# Patient Record
Sex: Male | Born: 2005 | Race: White | Hispanic: Yes | Marital: Single | Smoking: Never smoker
Health system: Southern US, Community
[De-identification: ages and names within clinical notes are randomized; demographics above are authoritative.]

## PROBLEM LIST (undated history)

## (undated) ENCOUNTER — Emergency Department (HOSPITAL_COMMUNITY): Disposition: A | Discharge status: Home / Self Care | Payer: Self-pay

## (undated) DIAGNOSIS — A630 Anogenital (venereal) warts: Secondary | ICD-10-CM

## (undated) DIAGNOSIS — J45909 Unspecified asthma, uncomplicated: Secondary | ICD-10-CM

## (undated) HISTORY — PX: NO PAST SURGERIES: SHX2092

---
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
## 2023-07-05 ENCOUNTER — Ambulatory Visit: Payer: Self-pay | Admitting: General Surgery

## 2023-07-05 NOTE — H&P (Signed)
PROVIDER:  Elenora Gamma, MD  MRN: [id number] DOB: [birth year] DATE OF ENCOUNTER: 07/05/2023 Interval History:  Patient underwent laser ablation of anal condyloma on October 14, 2022.  Patient mostly had internal lesions around the dentate line.  4 small external lesions were also ablated.  He states that he did relatively well after surgery. We then did a course of imiquimod cream.  On repeat examination, he had a small recurrence noted at anterior midline.  This was treated with silver nitrate here in the office.   He is here today for his 10-month follow-up evaluation.  Past Medical History:  Diagnosis Date   Anal condyloma    Asthma    Past Surgical History:  Procedure Laterality Date   LASER ABLATION CONDOLAMATA N/A 10/14/2022   Procedure: LASER ABLATION ANAL CONDOLAMATA;  Surgeon: Romie Levee, MD;  Location: [facility];  Service: General;  Laterality: N/A;   NO PAST SURGERIES     No family history on file. Social History   Socioeconomic History   Marital status: Single    Spouse name: Not on file   Number of children: Not on file   Years of education: Not on file   Highest education level: Not on file  Occupational History   Not on file  Tobacco Use   Smoking status: Never   Smokeless tobacco: Never  Vaping Use   Vaping status: Never Used  Substance and Sexual Activity   Alcohol use: Never   Drug use: Never   Sexual activity: Not on file  Other Topics Concern   Not on file  Social History Narrative   Not on file   Social Determinants of Health   Financial Resource Strain: Not on file  Food Insecurity: Not on file  Transportation Needs: Not on file  Physical Activity: Not on file  Stress: Not on file  Social Connections: Not on file  Intimate Partner Violence: Not on file    Current Outpatient Medications:    albuterol (VENTOLIN HFA) 108 (90 Base) MCG/ACT inhaler, Inhale 1-2 puffs into the lungs every 6 (six) hours as needed for  wheezing or shortness of breath., Disp: , Rfl:    loratadine (CLARITIN REDITABS) 10 MG dissolvable tablet, Take 10 mg by mouth as needed for allergies., Disp: , Rfl:    oxyCODONE (OXY IR/ROXICODONE) 5 MG immediate release tablet, Take 1 tablet (5 mg total) by mouth every 6 (six) hours as needed for severe pain., Disp: 20 tablet, Rfl: 0 Allergies  Allergen Reactions   Asa [Aspirin] Rash   Egg-Derived Products Rash    Per pt cooked ok but raw eggs rash   Ibuprofen Rash   Review of Systems - Negative except as stated above    Physical Examination:  Gen: NAD CV: RRR Lungs: CTA Abd: soft Rectal: normal external exam, mass noted at ant midline    Procedure: Anoscopy Surgeon: Maisie Fus After the risks and benefits were explained, written consent was obtained for above procedure.  A medical assistant chaperone was present thoroughout the entire procedure.  Anesthesia: none Diagnosis: h/o anal condyloma Findings: lesion noted at anterior midline at the dentate line.  This does not appear to be resolving   Assessment and Plan:  Nathaniel Vaughan is a 17 y.o. male who underwent laser ablation of mostly internal anal condyloma in Jan 24.  We then did a course of imiquimod cream.  On repeat examination he had a small recurrence noted at anterior midline.  This was treated with  silver nitrate here in the office.  On exam today, he has recurrence of the lesion at anterior midline.  I have recommended repeat laser ablation in the operating room.  Patient is agreeable to this procedure.  Diagnoses and all orders for this visit:  Anal condyloma    The plan was discussed in detail with the patient today, who expressed understanding.  The patient has my contact information, and understands to call me with any additional questions or concerns in the interval.  I would be happy to see the patient back sooner if the need arises.   [name], MD Colon and Rectal Surgery [facility]  Surgery

## 2023-08-29 ENCOUNTER — Encounter (HOSPITAL_BASED_OUTPATIENT_CLINIC_OR_DEPARTMENT_OTHER): Payer: Self-pay | Admitting: General Surgery

## 2023-08-30 ENCOUNTER — Encounter (HOSPITAL_BASED_OUTPATIENT_CLINIC_OR_DEPARTMENT_OTHER): Payer: Self-pay | Admitting: General Surgery

## 2023-08-30 ENCOUNTER — Other Ambulatory Visit: Payer: Self-pay

## 2023-08-30 NOTE — Progress Notes (Signed)
Spoke w/ via phone for pre-op interview---[name] needs dos---- none        Lab results------none COVID test -----patient states asymptomatic no test needed Arrive at -------0915 on Thursday, 09/01/2023 NPO after MN NO Solid Food.  Clear liquids from MN until---0815 Med rec completed Medications to take morning of surgery -----Descovy, Claritin prn, Albuterol inhaler prn Diabetic medication -----n/a Patient instructed no nail polish to be worn day of surgery Patient instructed to bring photo id and insurance card day of surgery Patient aware to have Driver (ride ) / caregiver    for 24 hours after surgery - father, Arnette Norris (Father speaks Spanish only.) Patient Special Instructions -----Bring inhaler on day of surgery.  Pre-Op special Instructions -----none Patient verbalized understanding of instructions that were given at this phone interview. Patient denies chest pain, sob, fever, cough at the interview.

## 2023-09-01 ENCOUNTER — Ambulatory Visit (HOSPITAL_BASED_OUTPATIENT_CLINIC_OR_DEPARTMENT_OTHER): Payer: Self-pay | Admitting: Certified Registered Nurse Anesthetist

## 2023-09-01 ENCOUNTER — Ambulatory Visit (HOSPITAL_BASED_OUTPATIENT_CLINIC_OR_DEPARTMENT_OTHER)
Admission: RE | Admit: 2023-09-01 | Discharge: 2023-09-01 | Disposition: A | Discharge status: Home / Self Care | Payer: Medicaid Other | Attending: General Surgery | Admitting: General Surgery

## 2023-09-01 ENCOUNTER — Encounter (HOSPITAL_BASED_OUTPATIENT_CLINIC_OR_DEPARTMENT_OTHER): Payer: Self-pay | Admitting: General Surgery

## 2023-09-01 ENCOUNTER — Other Ambulatory Visit: Payer: Self-pay

## 2023-09-01 ENCOUNTER — Encounter (HOSPITAL_BASED_OUTPATIENT_CLINIC_OR_DEPARTMENT_OTHER)
Admission: RE | Disposition: A | Discharge status: Home / Self Care | Payer: Self-pay | Source: Home / Self Care | Attending: General Surgery

## 2023-09-01 DIAGNOSIS — A5131 Condyloma latum: Secondary | ICD-10-CM

## 2023-09-01 DIAGNOSIS — A630 Anogenital (venereal) warts: Secondary | ICD-10-CM | POA: Insufficient documentation

## 2023-09-01 DIAGNOSIS — J45909 Unspecified asthma, uncomplicated: Secondary | ICD-10-CM | POA: Insufficient documentation

## 2023-09-01 DIAGNOSIS — Z01818 Encounter for other preprocedural examination: Secondary | ICD-10-CM

## 2023-09-01 HISTORY — PX: LASER ABLATION CONDOLAMATA: SHX5941

## 2023-09-01 SURGERY — ABLATION, CONDYLOMA, USING LASER
Anesthesia: Monitor Anesthesia Care | Site: Rectum

## 2023-09-01 MED ORDER — ONDANSETRON HCL 4 MG/2ML IJ SOLN
INTRAMUSCULAR | Status: DC | PRN
  Administered 2023-09-01: 4 mg via INTRAVENOUS

## 2023-09-01 MED ORDER — GABAPENTIN 300 MG PO CAPS
ORAL_CAPSULE | ORAL | Status: AC
  Filled 2023-09-01: qty 1

## 2023-09-01 MED ORDER — MIDAZOLAM HCL 2 MG/2ML IJ SOLN
INTRAMUSCULAR | Status: DC | PRN
  Administered 2023-09-01: 2 mg via INTRAVENOUS

## 2023-09-01 MED ORDER — SODIUM CHLORIDE 0.9% FLUSH: 3.0000 mL | Freq: Two times a day (BID) | INTRAVENOUS | Status: DC

## 2023-09-01 MED ORDER — BUPIVACAINE LIPOSOME 1.3 % IJ SUSP: 20.0000 mL | Freq: Once | INTRAMUSCULAR | Status: DC

## 2023-09-01 MED ORDER — MIDAZOLAM HCL 2 MG/2ML IJ SOLN
INTRAMUSCULAR | Status: AC
  Filled 2023-09-01: qty 2

## 2023-09-01 MED ORDER — GABAPENTIN 300 MG PO CAPS
300.0000 mg | ORAL_CAPSULE | ORAL | Status: AC
  Administered 2023-09-01: 300 mg via ORAL

## 2023-09-01 MED ORDER — ACETAMINOPHEN 500 MG PO TABS
1000.0000 mg | ORAL_TABLET | ORAL | Status: AC
  Administered 2023-09-01: 1000 mg via ORAL

## 2023-09-01 MED ORDER — PROPOFOL 500 MG/50ML IV EMUL
INTRAVENOUS | Status: DC | PRN
  Administered 2023-09-01: 200 ug/kg/min via INTRAVENOUS

## 2023-09-01 MED ORDER — ACETIC ACID 5 % SOLN
Status: DC | PRN
  Administered 2023-09-01: 1 via TOPICAL

## 2023-09-01 MED ORDER — SODIUM CHLORIDE 0.9 % IV SOLN: INTRAVENOUS | Status: DC

## 2023-09-01 MED ORDER — ACETAMINOPHEN 500 MG PO TABS
ORAL_TABLET | ORAL | Status: AC
  Filled 2023-09-01: qty 2

## 2023-09-01 MED ORDER — FENTANYL CITRATE (PF) 250 MCG/5ML IJ SOLN
INTRAMUSCULAR | Status: DC | PRN
  Administered 2023-09-01: 25 ug via INTRAVENOUS
  Administered 2023-09-01: 50 ug via INTRAVENOUS
  Administered 2023-09-01: 25 ug via INTRAVENOUS

## 2023-09-01 MED ORDER — GLYCOPYRROLATE 0.2 MG/ML IJ SOLN
INTRAMUSCULAR | Status: DC | PRN
  Administered 2023-09-01 (×2): .1 mg via INTRAVENOUS

## 2023-09-01 MED ORDER — PROPOFOL 10 MG/ML IV BOLUS
INTRAVENOUS | Status: DC | PRN
  Administered 2023-09-01 (×2): 20 mg via INTRAVENOUS

## 2023-09-01 MED ORDER — LIDOCAINE 2% (20 MG/ML) 5 ML SYRINGE
INTRAMUSCULAR | Status: DC | PRN
  Administered 2023-09-01: 50 mg via INTRAVENOUS

## 2023-09-01 MED ORDER — BUPIVACAINE-EPINEPHRINE 0.5% -1:200000 IJ SOLN
INTRAMUSCULAR | Status: DC | PRN
  Administered 2023-09-01: 30 mL

## 2023-09-01 MED ORDER — DEXMEDETOMIDINE HCL IN NACL 80 MCG/20ML IV SOLN
INTRAVENOUS | Status: DC | PRN
  Administered 2023-09-01: 4 ug via INTRAVENOUS
  Administered 2023-09-01 (×2): 8 ug via INTRAVENOUS

## 2023-09-01 MED ORDER — OXYCODONE HCL 5 MG PO TABS: 5.0000 mg | ORAL_TABLET | Freq: Four times a day (QID) | ORAL | 0 refills | Status: AC | PRN

## 2023-09-01 MED ORDER — FENTANYL CITRATE (PF) 100 MCG/2ML IJ SOLN
INTRAMUSCULAR | Status: AC
  Filled 2023-09-01: qty 2

## 2023-09-01 MED ORDER — STERILE WATER FOR IRRIGATION IR SOLN
Status: DC | PRN
Start: 2023-09-01 — End: 2023-09-01
  Administered 2023-09-01: 500 mL

## 2023-09-01 SURGICAL SUPPLY — 40 items
BLADE SURG 10 STRL SS (BLADE) IMPLANT
BRIEF MESH DISP LRG (UNDERPADS AND DIAPERS) ×2 IMPLANT
COVER BACK TABLE 60X90IN (DRAPES) ×2 IMPLANT
COVER MAYO STAND STRL (DRAPES) ×2 IMPLANT
DRAPE HYSTEROSCOPY (MISCELLANEOUS) IMPLANT
DRAPE LAPAROTOMY 100X72 PEDS (DRAPES) ×2 IMPLANT
DRAPE SHEET LG 3/4 BI-LAMINATE (DRAPES) IMPLANT
DRAPE UTILITY XL STRL (DRAPES) ×2 IMPLANT
ELECT REM PT RETURN 9FT ADLT (ELECTROSURGICAL) ×1
ELECTRODE REM PT RTRN 9FT ADLT (ELECTROSURGICAL) ×2 IMPLANT
GAUZE 4X4 16PLY ~~LOC~~+RFID DBL (SPONGE) ×2 IMPLANT
GAUZE PAD ABD 8X10 STRL (GAUZE/BANDAGES/DRESSINGS) ×2 IMPLANT
GAUZE SPONGE 4X4 12PLY STRL (GAUZE/BANDAGES/DRESSINGS) IMPLANT
GLOVE BIO SURGEON STRL SZ 6.5 (GLOVE) ×2 IMPLANT
GLOVE BIOGEL PI IND STRL 6.5 (GLOVE) IMPLANT
GLOVE INDICATOR 6.5 STRL GRN (GLOVE) ×2 IMPLANT
GLOVE SURG SS PI 7.0 STRL IVOR (GLOVE) IMPLANT
GOWN STRL REUS W/TWL LRG LVL3 (GOWN DISPOSABLE) IMPLANT
GOWN STRL REUS W/TWL XL LVL3 (GOWN DISPOSABLE) ×2 IMPLANT
KIT SIGMOIDOSCOPE (SET/KITS/TRAYS/PACK) IMPLANT
KIT TURNOVER CYSTO (KITS) ×2 IMPLANT
LEGGING LITHOTOMY PAIR STRL (DRAPES) IMPLANT
NDL HYPO 22X1.5 SAFETY MO (MISCELLANEOUS) ×2 IMPLANT
NDL SAFETY ECLIPSE 18X1.5 (NEEDLE) IMPLANT
NEEDLE HYPO 22X1.5 SAFETY MO (MISCELLANEOUS) ×1
NS IRRIG 500ML POUR BTL (IV SOLUTION) ×2 IMPLANT
PACK BASIN DAY SURGERY FS (CUSTOM PROCEDURE TRAY) ×2 IMPLANT
PENCIL SMOKE EVACUATOR (MISCELLANEOUS) ×2 IMPLANT
SLEEVE SCD COMPRESS KNEE MED (STOCKING) ×2 IMPLANT
SPIKE FLUID TRANSFER (MISCELLANEOUS) ×2 IMPLANT
SPONGE SURGIFOAM ABS GEL 12-7 (HEMOSTASIS) IMPLANT
SUT CHROMIC 2 0 SH (SUTURE) IMPLANT
SUT CHROMIC 3 0 SH 27 (SUTURE) IMPLANT
SYR CONTROL 10ML LL (SYRINGE) ×2 IMPLANT
TOWEL OR 17X24 6PK STRL BLUE (TOWEL DISPOSABLE) ×4 IMPLANT
TRAY DSU PREP LF (CUSTOM PROCEDURE TRAY) ×2 IMPLANT
TUBE CONNECTING 12X1/4 (SUCTIONS) ×2 IMPLANT
VACUUM HOSE 7/8X10 W/ WAND (MISCELLANEOUS) ×2 IMPLANT
WATER STERILE IRR 500ML POUR (IV SOLUTION) IMPLANT
YANKAUER SUCT BULB TIP NO VENT (SUCTIONS) ×2 IMPLANT

## 2023-09-01 NOTE — Anesthesia Preprocedure Evaluation (Signed)
Anesthesia Evaluation  Patient identified by MRN, date of birth, ID band Patient awake    Reviewed: Allergy & Precautions, H&P , NPO status , Patient's Chart, lab work & pertinent test results  Airway Mallampati: II  TM Distance: >3 FB Neck ROM: Full    Dental no notable dental hx.    Pulmonary asthma    Pulmonary exam normal breath sounds clear to auscultation       Cardiovascular negative cardio ROS Normal cardiovascular exam Rhythm:Regular Rate:Normal     Neuro/Psych negative neurological ROS  negative psych ROS   GI/Hepatic negative GI ROS, Neg liver ROS,,,  Endo/Other  negative endocrine ROS    Renal/GU negative Renal ROS  negative genitourinary   Musculoskeletal negative musculoskeletal ROS (+)    Abdominal   Peds negative pediatric ROS (+)  Hematology negative hematology ROS (+)   Anesthesia Other Findings   Reproductive/Obstetrics negative OB ROS                             Anesthesia Physical Anesthesia Plan  ASA: 2  Anesthesia Plan: MAC   Post-op Pain Management: Tylenol PO (pre-op)* and Gabapentin PO (pre-op)*   Induction: Intravenous  PONV Risk Score and Plan: 1 and Propofol infusion and Treatment may vary due to age or medical condition  Airway Management Planned: Simple Face Mask  Additional Equipment:   Intra-op Plan:   Post-operative Plan:   Informed Consent: I have reviewed the patients History and Physical, chart, labs and discussed the procedure including the risks, benefits and alternatives for the proposed anesthesia with the patient or authorized representative who has indicated his/her understanding and acceptance.     Dental advisory given  Plan Discussed with: CRNA and Surgeon  Anesthesia Plan Comments:        Anesthesia Quick Evaluation

## 2023-09-01 NOTE — H&P (Signed)
PROVIDER:  Elenora Gamma, MD   MRN: [id number] DOB: Jun 06, 2006  Interval History:   Patient underwent laser ablation of anal condyloma on October 14, 2022.  Patient mostly had internal lesions around the dentate line.  4 small external lesions were also ablated.  He states that he did relatively well after surgery. We then did a course of imiquimod cream.  On repeat examination, he had a small recurrence noted at anterior midline.  This was treated with silver nitrate here in the office.   He was noted to have persistent lesions on f/u exam       Past Medical History:  Diagnosis Date   Anal condyloma     Asthma           Past Surgical History:  Procedure Laterality Date   LASER ABLATION CONDOLAMATA N/A 10/14/2022    Procedure: LASER ABLATION ANAL CONDOLAMATA;  Surgeon: Romie Levee, MD;  Location: [facility];  Service: General;  Laterality: N/A;   NO PAST SURGERIES        No family history on file. Social History         Socioeconomic History   Marital status: Single      Spouse name: Not on file   Number of children: Not on file   Years of education: Not on file   Highest education level: Not on file  Occupational History   Not on file  Tobacco Use   Smoking status: Never   Smokeless tobacco: Never  Vaping Use   Vaping status: Never Used  Substance and Sexual Activity   Alcohol use: Never   Drug use: Never   Sexual activity: Not on file  Other Topics Concern   Not on file  Social History Narrative   Not on file    Social Determinants of Health    Financial Resource Strain: Not on file  Food Insecurity: Not on file  Transportation Needs: Not on file  Physical Activity: Not on file  Stress: Not on file  Social Connections: Not on file  Intimate Partner Violence: Not on file      Current Outpatient Medications:    albuterol (VENTOLIN HFA) 108 (90 Base) MCG/ACT inhaler, Inhale 1-2 puffs into the lungs every 6 (six) hours as needed  for wheezing or shortness of breath., Disp: , Rfl:    loratadine (CLARITIN REDITABS) 10 MG dissolvable tablet, Take 10 mg by mouth as needed for allergies., Disp: , Rfl:    oxyCODONE (OXY IR/ROXICODONE) 5 MG immediate release tablet, Take 1 tablet (5 mg total) by mouth every 6 (six) hours as needed for severe pain., Disp: 20 tablet, Rfl: 0      Allergies  Allergen Reactions   Asa [Aspirin] Rash   Egg-Derived Products Rash      Per pt cooked ok but raw eggs rash   Ibuprofen Rash    Review of Systems - Negative except as stated above     Physical Examination:   Vitals:   09/01/23 0918  BP: 132/86  Pulse: 82  Resp: 16  Temp: 98.9 F (37.2 C)  SpO2: 98%     Gen: NAD CV: RRR Lungs: CTA Abd: soft Rectal: normal external exam, mass noted at ant midline      Procedure: Anoscopy 07/05/23 Surgeon: Maisie Fus After the risks and benefits were explained, written consent was obtained for above procedure.  A medical assistant chaperone was present thoroughout the entire procedure.  Anesthesia: none Diagnosis: h/o anal condyloma  Findings: lesion noted at anterior midline at the dentate line.  This does not appear to be resolving    Assessment and Plan:   Nathaniel Vaughan is a 17 y.o. male who underwent laser ablation of mostly internal anal condyloma in Jan 24.  We then did a course of imiquimod cream.  On repeat examination he had a small recurrence noted at anterior midline.  This was treated with silver nitrate here in the office.  On exam today, he has recurrence of the lesion at anterior midline.  I have recommended repeat laser ablation in the operating room.  Patient is agreeable to this procedure.   Diagnoses and all orders for this visit:   Anal condyloma       The plan was discussed in detail with the patient today, who expressed understanding.  The patient has my contact information, and understands to call me with any additional questions or concerns in the interval.   I would be happy to see the patient back sooner if the need arises.    [name], MD Colon and Rectal Surgery [facility]

## 2023-09-01 NOTE — Transfer of Care (Signed)
Immediate Anesthesia Transfer of Care Note  Patient: Nathaniel Vaughan  Procedure(s) Performed: LASER ABLATION CONDYLOMA (Rectum)  Patient Location: PACU  Anesthesia Type:MAC  Level of Consciousness: sedated  Airway & Oxygen Therapy: Patient Spontanous Breathing and Patient connected to face mask oxygen  Post-op Assessment: Report given to RN and Post -op Vital signs reviewed and stable  Post vital signs: Reviewed and stable  Last Vitals:  Vitals Value Taken Time  BP    Temp 36.7 C 09/01/23 1051  Pulse 79 09/01/23 1052  Resp 21 09/01/23 1052  SpO2 100 % 09/01/23 1052  Vitals shown include unfiled device data.  Last Pain:  Vitals:   09/01/23 0918  TempSrc: Oral         Complications: No notable events documented.

## 2023-09-01 NOTE — Discharge Instructions (Addendum)

## 2023-09-01 NOTE — Op Note (Signed)
09/01/2023  10:46 AM  PATIENT:  Nathaniel Vaughan  17 y.o. male  Patient Care Team: Pcp, No as PCP - General  PRE-OPERATIVE DIAGNOSIS:  anal condyloma  POST-OPERATIVE DIAGNOSIS:  anal condyloma  PROCEDURE: LASER ABLATION CONDYLOMA   Surgeon(s): Romie Levee, MD  ASSISTANT: none   ANESTHESIA:   local and MAC  SPECIMEN:  No Specimen  DISPOSITION OF SPECIMEN:  N/A  COUNTS:  YES  PLAN OF CARE: Discharge to home after PACU  PATIENT DISPOSITION:  PACU - hemodynamically stable.  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  MAC anesthesia was induced without difficulty. The patient was then positioned in prone jackknife position with buttocks gently taped apart.  The patient was then prepped and draped in usual sterile fashion.  SCDs were noted to be in place prior to the initiation of anesthesia. A surgical timeout was performed indicating the correct patient, procedure, positioning and need for preoperative antibiotics.  A rectal block was performed using Marcaine with epinephrine.    I began with a digital rectal exam.  The condyloma could be palpated in the anterior midline.  I placed an acetic acid soaked sponge into the anal canal and allowed this to sit for approximately 2 minutes to easily identify any occult condyloma.  I then placed a Hill-Ferguson anoscope into the anal canal and evaluated this completely.  There was some mild diffuse acetowhite staining but no other major lesion than at anterior midline.  Next, the area was prepared for laser treatment.  Soaked towels were placed around the anal canal and the laser was brought onto the field.  It was set at 10 continuous.  All staff was appropriately protected with eyewear.  We then used the laser to ablate all  of the remaining condyloma.  Additional Marcaine was placed underneath of these areas for hemostasis and pain control.  Once this was complete, a dressing was applied the patient was awakened from anesthesia and sent to the postanesthesia care unit in stable condition.  All counts were correct per operating room staff.  [name], MD  Colorectal and General Surgery [facility]

## 2023-09-01 NOTE — Anesthesia Postprocedure Evaluation (Signed)
Anesthesia Post Note  Patient: Nathaniel Vaughan  Procedure(s) Performed: LASER ABLATION CONDYLOMA (Rectum)     Patient location during evaluation: PACU Anesthesia Type: MAC Level of consciousness: awake and alert Pain management: pain level controlled Vital Signs Assessment: post-procedure vital signs reviewed and stable Respiratory status: spontaneous breathing, nonlabored ventilation, respiratory function stable and patient connected to nasal cannula oxygen Cardiovascular status: stable and blood pressure returned to baseline Postop Assessment: no apparent nausea or vomiting Anesthetic complications: no  No notable events documented.  Last Vitals:  Vitals:   09/01/23 0918 09/01/23 1051  BP: 132/86 (!) 95/50  Pulse: 82   Resp: 16   Temp: 37.2 C 36.7 C  SpO2: 98%     Last Pain:  Vitals:   09/01/23 0918  TempSrc: Oral                 Jaclyn Carew S

## 2023-09-05 ENCOUNTER — Encounter (HOSPITAL_BASED_OUTPATIENT_CLINIC_OR_DEPARTMENT_OTHER): Payer: Self-pay | Admitting: General Surgery

## 2024-01-23 NOTE — Progress Notes (Signed)
   PROVIDER:  BERNARDA WANDA NED, MD  MRN: [id number] DOB: June 01, 2006 DATE OF ENCOUNTER: 01/23/2024 Interval History:   Patient underwent laser ablation of anal condyloma on October 14, 2022. Patient mostly had internal lesions around the dentate line. 4 small external lesions were also ablated. He states that he did relatively well after surgery. We then did a course of imiquimod cream. On repeat examination, he had a small recurrence noted at anterior midline. This was treated with silver nitrate here in the office. He was noted to have persistent lesions on f/u exam and he underwent repeat laser ablation to anterior midline lesions in early December 2024.  We have been following him closely since then.  Physical Examination:   Gen: NAD  Incision: ant midline palpable lesions, 1 linear 5mm L lateral lesion Anoscopy Findings: no internal lesions noted  Procedure: Ablation Surgeon: NED After the risks and benefits were explained, written consent was obtained for above procedure.  A medical assistant chaperone was present thoroughout the entire procedure.  Anesthesia: 20% Benzocaine ointment, locally applied Diagnosis: anal condyloma Description: L lateral lesion treated with silver nitrate for ablation      Assessment and Plan:   Nathaniel Vaughan is a 18 y.o. male who underwent laser ablation for recurrent anal condyloma.  So far he is not developed any further recurrence internally.  He has 1 left lateral lesion today that we will try to treat topically.  Diagnoses and all orders for this visit:  Anal condyloma      There is no sign of ongoing internal lesions on exam today.  His LL external lesion was treated today with topical ablation.  We will have him follow-up in the office in 3 months for repeat anoscopy and recheck.  If his condylomatous lesion does not resolve in the next few weeks, he will call the office and we can retreat with podophyllin.  Return in  about 3 months (around 04/23/2024).   The plan was discussed in detail with the patient today, who expressed understanding.  The patient has my contact information, and understands to call me with any additional questions or concerns in the interval.  I would be happy to see the patient back sooner if the need arises.   BERNARDA JAYSON NED, MD Colon and Rectal Surgery [facility]

## 2024-04-17 NOTE — Progress Notes (Signed)
   PROVIDER:  BERNARDA WANDA NED, MD  MRN: [id number] DOB: [birth year] DATE OF ENCOUNTER: 04/17/2024 Interval History:   Patient underwent laser ablation of anal condyloma on October 14, 2022. Patient mostly had internal lesions around the dentate line. 4 small external lesions were also ablated. He states that he did relatively well after surgery. We then did a course of imiquimod cream. On repeat examination, he had a small recurrence noted at anterior midline. This was treated with silver nitrate here in the office. He was noted to have persistent lesions on f/u exam and he underwent repeat laser ablation to anterior midline lesions in early December 2024.  He then had 1 continued lesion in the left lateral anal canal.  This was treated with silver nitrate most recently.  He reports that the wart began to grow again and he used an over-the-counter product which he feels like has helped resolve the lesion.  Physical Examination:   Gen: NAD  Rectal: no lesions present externally     Assessment and Plan:   Nathaniel Vaughan is a 18 y.o. male who underwent laser ablation for recurrent anal condyloma.  So far he is not developed any further recurrence internally.  His external lesions have resolved as well.  We will have him come back in 6 months for a repeat internal and external exam.  Diagnoses and all orders for this visit:  Anal condyloma    Return in about 6 months (around 10/18/2024).   The plan was discussed in detail with the patient today, who expressed understanding.  The patient has my contact information, and understands to call me with any additional questions or concerns in the interval.  I would be happy to see the patient back sooner if the need arises.   BERNARDA JAYSON NED, MD Colon and Rectal Surgery [facility]
